# Patient Record
Sex: Female | Born: 1960 | Race: Black or African American | Hispanic: No | Marital: Married
Health system: Southern US, Community
[De-identification: ages and names within clinical notes are randomized; demographics above are authoritative.]

---

## 2013-04-22 HISTORY — PX: BREAST BIOPSY: SHX20

## 2018-01-13 DIAGNOSIS — M25572 Pain in left ankle and joints of left foot: Secondary | ICD-10-CM | POA: Diagnosis not present

## 2018-01-13 DIAGNOSIS — M76822 Posterior tibial tendinitis, left leg: Secondary | ICD-10-CM | POA: Diagnosis not present

## 2018-01-23 ENCOUNTER — Other Ambulatory Visit
Admission: RE | Admit: 2018-01-23 | Discharge: 2018-01-23 | Disposition: A | Discharge status: Home / Self Care | Payer: 59 | Source: Ambulatory Visit | Attending: Physician Assistant | Admitting: Physician Assistant

## 2018-01-23 DIAGNOSIS — Z7689 Persons encountering health services in other specified circumstances: Secondary | ICD-10-CM | POA: Diagnosis not present

## 2018-01-23 DIAGNOSIS — R0600 Dyspnea, unspecified: Secondary | ICD-10-CM | POA: Diagnosis not present

## 2018-01-23 DIAGNOSIS — R7303 Prediabetes: Secondary | ICD-10-CM | POA: Diagnosis not present

## 2018-01-23 DIAGNOSIS — R55 Syncope and collapse: Secondary | ICD-10-CM | POA: Diagnosis not present

## 2018-01-23 DIAGNOSIS — R0602 Shortness of breath: Secondary | ICD-10-CM | POA: Diagnosis not present

## 2018-01-23 LAB — FIBRIN DERIVATIVES D-DIMER (ARMC ONLY): FIBRIN DERIVATIVES D-DIMER (ARMC): 415.14 ng{FEU}/mL (ref 0.00–499.00)

## 2018-02-03 DIAGNOSIS — R55 Syncope and collapse: Secondary | ICD-10-CM | POA: Diagnosis not present

## 2018-02-03 DIAGNOSIS — R0600 Dyspnea, unspecified: Secondary | ICD-10-CM | POA: Diagnosis not present

## 2018-02-03 DIAGNOSIS — R002 Palpitations: Secondary | ICD-10-CM | POA: Diagnosis not present

## 2018-02-20 DIAGNOSIS — R55 Syncope and collapse: Secondary | ICD-10-CM | POA: Diagnosis not present

## 2018-02-20 DIAGNOSIS — R002 Palpitations: Secondary | ICD-10-CM | POA: Diagnosis not present

## 2018-02-20 DIAGNOSIS — R42 Dizziness and giddiness: Secondary | ICD-10-CM | POA: Diagnosis not present

## 2018-02-20 DIAGNOSIS — Z780 Asymptomatic menopausal state: Secondary | ICD-10-CM | POA: Diagnosis not present

## 2018-02-20 DIAGNOSIS — R0600 Dyspnea, unspecified: Secondary | ICD-10-CM | POA: Diagnosis not present

## 2018-02-20 DIAGNOSIS — R0683 Snoring: Secondary | ICD-10-CM | POA: Diagnosis not present

## 2018-02-20 DIAGNOSIS — R7303 Prediabetes: Secondary | ICD-10-CM | POA: Diagnosis not present

## 2018-02-23 ENCOUNTER — Other Ambulatory Visit: Payer: Self-pay | Admitting: Physician Assistant

## 2018-02-24 ENCOUNTER — Other Ambulatory Visit: Payer: Self-pay | Admitting: Physician Assistant

## 2018-02-24 DIAGNOSIS — G4489 Other headache syndrome: Secondary | ICD-10-CM

## 2018-02-25 DIAGNOSIS — G4733 Obstructive sleep apnea (adult) (pediatric): Secondary | ICD-10-CM | POA: Diagnosis not present

## 2018-03-18 ENCOUNTER — Ambulatory Visit: Payer: Self-pay

## 2018-03-25 DIAGNOSIS — G4733 Obstructive sleep apnea (adult) (pediatric): Secondary | ICD-10-CM | POA: Diagnosis not present

## 2018-03-25 DIAGNOSIS — R42 Dizziness and giddiness: Secondary | ICD-10-CM | POA: Diagnosis not present

## 2018-03-25 DIAGNOSIS — R7303 Prediabetes: Secondary | ICD-10-CM | POA: Diagnosis not present

## 2018-04-20 ENCOUNTER — Ambulatory Visit: Payer: 59

## 2018-04-20 ENCOUNTER — Ambulatory Visit
Admission: RE | Admit: 2018-04-20 | Discharge: 2018-04-20 | Disposition: A | Discharge status: Home / Self Care | Payer: 59 | Source: Ambulatory Visit | Attending: Physician Assistant | Admitting: Physician Assistant

## 2018-04-20 DIAGNOSIS — G4489 Other headache syndrome: Secondary | ICD-10-CM | POA: Insufficient documentation

## 2018-04-20 MED ORDER — GADOBUTROL 1 MMOL/ML IV SOLN
10.0000 mL | Freq: Once | INTRAVENOUS | Status: AC | PRN
  Administered 2018-04-20: 10 mL via INTRAVENOUS

## 2018-06-01 ENCOUNTER — Ambulatory Visit: Payer: 59 | Attending: Neurology

## 2018-06-01 VITALS — BP 121/81 | HR 70

## 2018-06-01 DIAGNOSIS — R2681 Unsteadiness on feet: Secondary | ICD-10-CM | POA: Diagnosis present

## 2018-06-01 DIAGNOSIS — R42 Dizziness and giddiness: Secondary | ICD-10-CM

## 2018-06-01 NOTE — Therapy (Signed)
[place] [facility] MAIN [facility] SERVICES [address] [place], [place], [address] Phone: [phone]   Fax:  [phone]  Physical Therapy Evaluation  Patient Details  Name: Sydney Wright MRN: [id number] Date of Birth: 04/20/1961 Referring Provider (PT): Dr. Sherryll Burger   Encounter Date: 06/01/2018  PT End of Session - 06/02/18 2133    Visit Number  1    Number of Visits  25    Date for PT Re-Evaluation  08/24/18    Authorization Type  eval: 06/01/18    PT Start Time  0800    PT Stop Time  0900    PT Time Calculation (min)  60 min    Equipment Utilized During Treatment  Gait belt    Activity Tolerance  Patient tolerated treatment well    Behavior During Therapy  [facility] for tasks assessed/performed       History reviewed. No pertinent past medical history.  History reviewed. No pertinent surgical history.  Vitals:   06/01/18 0803  BP: 121/81  Pulse: 70  SpO2: 100%     Subjective Assessment - 06/02/18 2131    Subjective  Imbalance/dizziness    Pertinent History  Pt reports that approximately 1 year ago she tripped on a cable and fell onto her L side. She was shaken up and tender on the L side. She reports that at that time she felt like her L foot had a little "twitch" to it. She started an exercise program and after getting off the bike one session she couldn't stand on her L foot because of the "pressure" in her foot. She went to see the podiatrist who stated that she had a severe strain in her L foot and was put into an ankle brace. She was diagnosed with L posterior tibial tendinitis. She reports that she started to feel "off-balance" and dizzy when walking after that incident. "I start to feel wobbly when I walk." She complains of imbalance but also reports feeling "dizzy". "I feel nervous about falling." She states if she smells certain smells or changes in the weather occur it can trigger a dizzy episode which would last "a couple minutes." She  started to take Vitamin D and vitamin B12 at the recommendation of Dr. Sherryll Burger and states that she feels like her symptoms are getting better. She has only had one dizzy spell in the last 4 weeks. She is also complaining of L foot pain initially upon standing in the morning but pain improves after walking approximately 15 feet. .  She denies any associated numbness but does complain of weakness in her L ankle/foot. She complains of numbness for "a few seconds" in her 2nd or 3rd toe of the left foot. She saw [facility] and all testing was negative. She saw neurology who ordered an NCV of her LLE which was WNL. She had a MRI Brain done on 04/20/2018 which showed an arachnoid cyst but no findings which explaned her symptoms. Pt has seen cardiology and per medical record they ruled out any possible cardiac causes of her symptoms. She denies any chest pain during episodes but does reports some "shortness of breath." She denies DOE with exercise. She has a history migraines but these always occurred with her menstrual cycle. Pt states she "crossed over into menopause" as of November 2019. Last migraine was a year ago and was associated with light and sound sensitivity. She also would have nausea and dizziness with her migraines. Pt denies any new onset stress  or anxiety. No prior history of panic attacks. She was recently diagnosed with OSA based on a sleep study. She has not started to use a CPAP yet. No history of ocular or vestibular migraines. Other than the above history pt denies any additional recent changes in her health.    Diagnostic tests  MRI: see history    Patient Stated Goals  Decrease dizziness, improve balance, and get rid of her foot pain    Currently in Pain?  No/denies        [facility] - 06/03/18 0824      Assessment   Medical Diagnosis  Dizziness/imbalance    Referring Provider (PT)  Dr. Sherryll BurgerShah    Onset Date/Surgical Date  06/02/17   Approximate   Next MD Visit  Not reported by patient     Prior Therapy  None reported by patient      Precautions   Precautions  None    Precaution Comments  She wears a L ankle brace intermittently      Restrictions   Weight Bearing Restrictions  No      Balance Screen   Has the patient fallen in the past 6 months  No    Has the patient had a decrease in activity level because of a fear of falling?   Yes    Is the patient reluctant to leave their home because of a fear of falling?   No      Home Environment   Living Environment  Private residence    Available Help at Discharge  Family    Type of Home  House    Home Access  Level entry    Home Layout  Two level    Alternate Level Stairs-Number of Steps  14    Alternate Level Stairs-Rails  Right      Prior Function   Level of Independence  Independent    Vocation  Full time employment    Vocation Requirements  Works in Games developerpurchasing      Cognition   Overall Cognitive Status  Within Functional Limits for tasks assessed      Observation/Other Assessments   Other Surveys   Other Surveys    Activities of Corporate investment bankerBalance Confidence Scale (ABC Scale)   81.25%    Dizziness Handicap Inventory (DHI)   48/100         VESTIBULAR AND BALANCE EVALUATION   HISTORY:  Subjective history of current problem: Pt reports that approximately 1 year ago she tripped on a cable and fell onto her L side. She was shaken up and tender on the L side. She reports that at that time she felt like her L foot had a little "twitch" to it. She started an exercise program and after getting off the bike one session she couldn't stand on her L foot because of the "pressure" in her foot. She went to see the podiatrist who stated that she had a severe strain in her L foot and was put into an ankle brace. She was diagnosed with L posterior tibial tendinitis. She reports that she started to feel "off-balance" and dizzy when walking after that incident. "I start to feel wobbly when I walk." She complains of imbalance but also  reports feeling "dizzy". "I feel nervous about falling." She states if she smells certain smells or changes in the weather occur it can trigger a dizzy episode which would last "a couple minutes." She started to take Vitamin D and vitamin B12 at  the recommendation of Dr. Sherryll Burger and states that she feels like her symptoms are getting better. She has only had one dizzy spell in the last 4 weeks. She is also complaining of L foot pain initially upon standing in the morning but pain improves after walking approximately 15 feet. .  She denies any associated numbness but does complain of weakness in her L ankle/foot. She complains of numbness for "a few seconds" in her 2nd or 3rd toe of the left foot. She saw [facility] and all testing was negative. She saw neurology who ordered an NCV of her LLE which was WNL. She had a MRI Brain done on 04/20/2018 which showed an arachnoid cyst but no findings which explaned her symptoms. Pt has seen cardiology and per medical record they ruled out any possible cardiac causes of her symptoms. She denies any chest pain during episodes but does reports some "shortness of breath." She denies DOE with exercise. She has a history migraines but these always occurred with her menstrual cycle. Pt states she "crossed over into menopause" as of November 2019. Last migraine was a year ago and was associated with light and sound sensitivity. She also would have nausea and dizziness with her migraines. Pt denies any new onset stress or anxiety. No prior history of panic attacks. She was recently diagnosed with OSA based on a sleep study. She has not started to use a CPAP yet. No history of ocular or vestibular migraines. Other than the above history pt denies any additional recent changes in her health.  Description of dizziness: (vertigo, unsteadiness, lightheadedness, falling, general unsteadiness, whoozy, swimmy-headed sensation, aural fullness) Pt describes a rotary type sensation but denies  "vertigo." I feel like I'm tilted to the side. Pt reports that she feels "faint" due to being nervous that she is going to pass out or black out.  Frequency: Previously 1-2x/day but over the last 4 weeks since starting VitB12 and Vit D she has only had one episode. 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 Likes to read and go to the movie  Auditory complaints (tinnitus, pain, drainage): None Vision (last eye exam, diplopia, recent changes): None  Red Flags: (dysarthria, dysphagia, drop attacks, bowel and bladder changes, recent weight loss/gain) Review of systems negative for red flags.     EXAMINATION  POSTURE:   NEUROLOGICAL SCREEN: (2+ unless otherwise noted.) N=normal  Ab=abnormal  Level Dermatome R L Myotome R L Reflex R L  C3 Anterior Neck N N Sidebend C2-3 N N Jaw CN V    C4 Top of Shoulder N N Shoulder Shrug C4 N N Hoffman's UMN    C5 Lateral Upper Arm N N Shoulder ABD C4-5 N N Biceps C5-6    C6 Lateral Arm/ Thumb N N Arm Flex/ Wrist Ext C5-6 N N Brachiorad. C5-6    C7 Middle Finger N N Arm Ext//Wrist Flex C6-7 N N Triceps C7    C8 4th & 5th Finger N N Flex/ Ext Carpi Ulnaris C8 N N Patellar (L3-4)    T1 Medial Arm N  N Interossei T1 N N Gastrocnemius    L2 Medial thigh/groin N N Illiopsoas (L2-3) N N     L3 Lower thigh/med.knee N N Quadriceps (L3-4) N N     L4 Medial leg/lat thigh N N Tibialis Ant (L4-5) N N     L5 Lat. leg & dorsal foot N N EHL (L5)  N N     S1 post/lat foot/thigh/leg N N Gastrocnemius (S1-2) N N     S2 Post./med. thigh & leg N N Hamstrings (L4-S3) N N       SOMATOSENSORY:         Sensation           Intact      Diminished         Absent  Light touch WNL      COORDINATION: Finger to Nose: Normal Heel to Shin: Normal Pronator Drift: Negative Rapid alternating movements: Negative; Finger opposition: Negative  MUSCULOSKELETAL SCREEN: Cervical Spine ROM: WFL and painless in all planes. No gross deficits identified   ROM: WNL  MMT: WNL  Functional Mobility: WNL  Gait: Scanning of visual environment with gait is: WNL   OCULOMOTOR / VESTIBULAR TESTING:  Oculomotor Exam- Room Light  Findings Comments  Ocular Alignment normal   Ocular ROM normal   Spontaneous Nystagmus normal   End-Gaze Nystagmus normal   Smooth Pursuit normal   Saccades normal   VOR normal   VOR Cancellation normal   Left Head Thrust normal   Right Head Thrust normal   Head Shaking Nystagmus not examined   Static Acuity not examined   Dynamic Acuity not examined     Oculomotor Exam- Fixation Suppressed  Findings Comments  Ocular Alignment normal   Ocular ROM normal   Spontaneous Nystagmus normal   End-Gaze Nystagmus normal   Head Shaking Nystagmus normal     BPPV TESTS:  Symptoms Duration Intensity Nystagmus  L Dix-Hallpike None   None  R Dix-Hallpike Mild vertigo 10-15 seconds Does not rate Possibly a few R torsional upbeats but difficult too discern  L Head Roll None   None  R Head Roll None   None  L Sidelying Test      R Sidelying Test        FUNCTIONAL OUTCOME MEASURES:  Results Comments  DHI 48/100 Significant disability, in need of itnervention  ABC Scale 81.25% WNL          Objective measurements completed on examination: See above findings.      TREATMENT   Canalith Repositioning Treatment Due to possible positive R Dix-Hallpike test for R posterior canal BPPV pt treated with one bout of R  CRT. 1 minute holds in each position. Education provided to patient regarding possible diagnosis.        PT Education - 06/02/18 2133    Education Details  Plan of care    Person(s) Educated  Patient    Methods  Explanation    Comprehension  Verbalized understanding       PT Short Term Goals - 06/02/18 2145      PT SHORT TERM GOAL #1   Title  Pt will be independent with HEP in order to improve strength and balance in order to decrease fall risk and improve function at home and work.     Time  6    Period  Weeks    Status  New    Target Date  07/14/18        PT Long  Term Goals - 06/02/18 2146      PT LONG TERM GOAL #1   Title  Pt will report no further episodes of vertigo in the Dix-Hallpike position in order to resume her full function at home, work, and with leisure activities without increase in symptoms.     Baseline  06/01/18:    Time  12    Period  Weeks    Status  New    Target Date  08/24/18      PT LONG TERM GOAL #2   Title  Pt will decrease DHI score by at least 18 points in order to demonstrate clinically significant reduction in disability     Baseline  06/01/18: 48/100    Time  12    Period  Weeks    Status  New    Target Date  08/24/18             Plan - 06/02/18 2143    Clinical Impression Statement  Pt is a pleasant 58 year-old female referred for difficulty with balance/dizziness. Patient's history is quite complex and she has seen multiple specialists without any findings which explain her symptoms. PT examination is also mostly unremarkable however it is somewhat limited today secondary to extensive history. Oculomotor/vestibular exam is mostly normal with the exception of possibly positive R Dix-Hallpike test. Pt reports some short duration vertigo and there is possibly some R torsional upbeating nystagmus but it is very faint and difficult to detect. Pt taken through Epley maneuver for R posterior canal to see if it helps with her symptoms as it  certainly shouldn't aggravate any of her symptoms. Otherwise her symptoms don't easily fit into any other clear vestibular diagnosis. It is possible that anxiety may be a contributing factors to these acute, short duration symptoms that are associated with shortness of breath and lightheadedness. Will complete additional balance testing in future sessions and treat with canalith repositioning as appropriate. Once balance has resolved pt would like to pursue further treatment for her L ankle so order will be requested from her podiatrist. Pt presents with deficits in dizziness and balance and will benefit from skilled PT services to address deficits in balance and decrease risk for future falls.     History and Personal Factors relevant to plan of care:  High (unstable): 3 or more personal factors/comorbidities, 4 or more body systems/activity limitations/participation restrictions     Clinical Presentation  Unstable    Clinical Decision Making  High    Rehab Potential  Fair    PT Frequency  2x / week    PT Duration  12 weeks    PT Treatment/Interventions  ADLs/Self Care Home Management;Biofeedback;Aquatic Therapy;Canalith Repostioning;Cryotherapy;Electrical Stimulation;Iontophoresis 4mg /ml Dexamethasone;Moist Heat;Traction;Ultrasound;DME Instruction;Gait training;Stair training;Functional mobility training;Therapeutic activities;Therapeutic exercise;Balance training;Neuromuscular re-education;Patient/family education;Manual techniques;Passive range of motion;Dry needling;Vestibular    PT Next Visit Plan  BERG, FGA or DGI, recheck R Dix-Hallpike and treat with Epley if appropriate    PT Home Exercise Plan  None currently    Consulted and Agree with Plan of Care  Patient       Patient will benefit from skilled therapeutic intervention in order to improve the following deficits and impairments:  Dizziness, Decreased balance, Difficulty walking  Visit Diagnosis: Dizziness and giddiness - Plan: PT plan  of care cert/re-cert  Unsteadiness on feet - Plan: PT plan of care cert/re-cert     Problem List There are no active problems to display for this patient.  Sharalyn Ink Huprich PT, DPT, GCS  Huprich,Jason 06/03/2018, 8:34 AM  [facility] MAIN [facility] SERVICES [address] [place], [place], [address] Phone: [phone]   Fax:  [phone]  Name: FATINA SPRANKLE MRN: [id number] Date of Birth: 05-15-1960

## 2018-06-03 ENCOUNTER — Ambulatory Visit: Payer: 59

## 2018-06-08 ENCOUNTER — Ambulatory Visit: Payer: 59

## 2018-06-08 DIAGNOSIS — R2681 Unsteadiness on feet: Secondary | ICD-10-CM

## 2018-06-08 DIAGNOSIS — R42 Dizziness and giddiness: Secondary | ICD-10-CM | POA: Diagnosis not present

## 2018-06-08 NOTE — Therapy (Signed)
[facility] [address] [place], [place], [address] Phone: [phone]   Fax:  [phone]  Physical Therapy Treatment  Patient Details  Name: Sandria ManlyJacqueline B Hughley MRN: [id number] Date of Birth: 01/04/1961 Referring Provider (PT): Dr. Sherryll BurgerShah   Encounter Date: 06/08/2018  PT End of Session - 06/08/18 1520    Visit Number  2    Number of Visits  25    Date for PT Re-Evaluation  08/24/18    Authorization Type  eval: 06/01/18    PT Start Time  1517    PT Stop Time  1600    PT Time Calculation (min)  43 min    Equipment Utilized During Treatment  Gait belt    Activity Tolerance  Patient tolerated treatment well    Behavior During Therapy  [facility] for tasks assessed/performed       History reviewed. No pertinent past medical history.  History reviewed. No pertinent surgical history.  There were no vitals filed for this visit.  Subjective Assessment - 06/08/18 1519    Subjective  Pt reports that she is doing well on this date. She complains of some L ankle pain upon arrival. No acute episodes of dizziness since her initial evaluation. She would like to know if we can also treat her for her ankle pain.     Pertinent History  Pt reports that approximately 1 year ago she tripped on a cable and fell onto her L side. She was shaken up and tender on the L side. She reports that at that time she felt like her L foot had a little "twitch" to it. She started an exercise program and after getting off the bike one session she couldn't stand on her L foot because of the "pressure" in her foot. She went to see the podiatrist who stated that she had a severe strain in her L foot and was put into an ankle brace. She was diagnosed with L posterior tibial tendinitis. She reports that she started to feel "off-balance" and dizzy when walking after that incident. "I start to feel wobbly when I walk." She complains of imbalance but also reports feeling "dizzy".  "I feel nervous about falling." She states if she smells certain smells or changes in the weather occur it can trigger a dizzy episode which would last "a couple minutes." She started to take Vitamin D and vitamin B12 at the recommendation of Dr. Sherryll BurgerShah and states that she feels like her symptoms are getting better. She has only had one dizzy spell in the last 4 weeks. She is also complaining of L foot pain initially upon standing in the morning but pain improves after walking approximately 15 feet. .  She denies any associated numbness but does complain of weakness in her L ankle/foot. She complains of numbness for "a few seconds" in her 2nd or 3rd toe of the left foot. She saw [facility] and all testing was negative. She saw neurology who ordered an NCV of her LLE which was WNL. She had a MRI Brain done on 04/20/2018 which showed an arachnoid cyst but no findings which explaned her symptoms. Pt has seen cardiology and per medical record they ruled out any possible cardiac causes of her symptoms. She denies any chest pain during episodes but does reports some "shortness of breath." She denies DOE with exercise. She has a history migraines but these always occurred with her menstrual cycle. Pt states she "crossed over into menopause"  as of November 2019. Last migraine was a year ago and was associated with light and sound sensitivity. She also would have nausea and dizziness with her migraines. Pt denies any new onset stress or anxiety. No prior history of panic attacks. She was recently diagnosed with OSA based on a sleep study. She has not started to use a CPAP yet. No history of ocular or vestibular migraines. Other than the above history pt denies any additional recent changes in her health.    Diagnostic tests  MRI: see history    Patient Stated Goals  Decrease dizziness, improve balance, and get rid of her foot pain    Currently in Pain?  Yes    Pain Score  2     Pain Location  Ankle    Pain Orientation   Medial;Left    Pain Descriptors / Indicators  Burning    Pain Type  Chronic pain    Pain Onset  More than a month ago    Pain Frequency  Intermittent         TREATMENT  [facility] PT Assessment - 06/08/18 1539      Standardized Balance Assessment   Standardized Balance Assessment  Berg Balance Test      Berg Balance Test   Sit to Stand  Able to stand without using hands and stabilize independently    Standing Unsupported  Able to stand safely 2 minutes    Sitting with Back Unsupported but Feet Supported on Floor or Stool  Able to sit safely and securely 2 minutes    Stand to Sit  Sits safely with minimal use of hands    Transfers  Able to transfer safely, minor use of hands    Standing Unsupported with Eyes Closed  Able to stand 10 seconds safely    Standing Ubsupported with Feet Together  Able to place feet together independently and stand 1 minute safely    From Standing, Reach Forward with Outstretched Arm  Can reach confidently >25 cm (10")    From Standing Position, Pick up Object from Floor  Able to pick up shoe safely and easily    From Standing Position, Turn to Look Behind Over each Shoulder  Looks behind from both sides and weight shifts well    Turn 360 Degrees  Able to turn 360 degrees safely in 4 seconds or less    Standing Unsupported, Alternately Place Feet on Step/Stool  Able to stand independently and safely and complete 8 steps in 20 seconds    Standing Unsupported, One Foot in Front  Able to plae foot ahead of the other independently and hold 30 seconds    Standing on One Leg  Tries to lift leg/unable to hold 3 seconds but remains standing independently    Total Score  52      Functional Gait  Assessment   Gait assessed   Yes    Gait Level Surface  Walks 20 ft in less than 5.5 sec, no assistive devices, good speed, no evidence for imbalance, normal gait pattern, deviates no more than 6 in outside of the 12 in walkway width.    Change in Gait Speed  Able to smoothly  change walking speed without loss of balance or gait deviation. Deviate no more than 6 in outside of the 12 in walkway width.    Gait with Horizontal Head Turns  Performs head turns smoothly with no change in gait. Deviates no more than 6 in outside 12 in walkway  width    Gait with Vertical Head Turns  Performs head turns with no change in gait. Deviates no more than 6 in outside 12 in walkway width.    Gait and Pivot Turn  Pivot turns safely within 3 sec and stops quickly with no loss of balance.    Step Over Obstacle  Is able to step over 2 stacked shoe boxes taped together (9 in total height) without changing gait speed. No evidence of imbalance.    Gait with Narrow Base of Support  Ambulates 4-7 steps.    Gait with Eyes Closed  Walks 20 ft, uses assistive device, slower speed, mild gait deviations, deviates 6-10 in outside 12 in walkway width. Ambulates 20 ft in less than 9 sec but greater than 7 sec.    Ambulating Backwards  Walks 20 ft, no assistive devices, good speed, no evidence for imbalance, normal gait    Steps  Alternating feet, no rail.    Total Score  27       TREATMENT  Neuromuscular Re-education  Negative Dix-Hallpike and roll testing bilaterally; Performed outcome measures with patient including: BERG: 52/56 FGA: 27/30 MiniBest: 26/28 Static visual acuity: 20/13 Dynamic visual acuity: 20/20 mCTSIB (see below) Issued and explained HEP; Issued vestibular migraine handout and educated pt regarding common triggers;    POSTURAL CONTROL TESTS:   Clinical Test of Sensory Interaction for Balance    (CTSIB):  CONDITION TIME STRATEGY SWAY  Eyes open, firm surface 30 seconds ankle 1+  Eyes closed, firm surface 30 seconds ankle 3+  Eyes open, foam surface 30 seconds ankle 2+  Eyes closed, foam surface 30 seconds ankle 3+      Dix-Hallpike and roll test is negative bilaterally on this date. Proceeded to perform additional outcome measures with patient. Overall she  demonstrates good balance. BERG was 52/56 with most deficits in tandem and single leg balance. FGA 27/30, MiniBest: 26/28, and DVA negative. Pt issued tandem and single leg balance to initiate HEP. Pt provided handout regarding vestibular migraines and encouraged her to look at common triggers. Will perform a couple additional balance treatments but pt reports she feels approximately 90% improved since her initial symptom onset. Once balance has resolved will proceed to request an order for PT from her L ankle pain. Per podiatry note she was diagnosed with posterior tibial tendonitis and he was considering referring for PT if it didn't resolve with ankle brace. Pt will benefit from PT services to address deficits in strength, balance, and mobility in order to return to full function at home.                  PT Short Term Goals - 06/02/18 2145      PT SHORT TERM GOAL #1   Title  Pt will be independent with HEP in order to improve strength and balance in order to decrease fall risk and improve function at home and work.     Time  6    Period  Weeks    Status  New    Target Date  07/14/18        PT Long Term Goals - 06/02/18 2146      PT LONG TERM GOAL #1   Title  Pt will report no further episodes of vertigo in the Dix-Hallpike position in order to resume her full function at home, work, and with leisure activities without increase in symptoms.     Baseline  06/01/18:    Time  12    Period  Weeks    Status  New    Target Date  08/24/18      PT LONG TERM GOAL #2   Title  Pt will decrease DHI score by at least 18 points in order to demonstrate clinically significant reduction in disability     Baseline  06/01/18: 48/100    Time  12    Period  Weeks    Status  New    Target Date  08/24/18            Plan - 06/08/18 1520    Clinical Impression Statement  Dix-Hallpike and roll test is negative bilaterally on this date. Proceeded to perform additional outcome measures  with patient. Overall she demonstrates good balance. BERG was 52/56 with most deficits in tandem and single leg balance. FGA 27/30, MiniBest: 26/28, and DVA negative. Pt issued tandem and single leg balance to initiate HEP. Pt provided handout regarding vestibular migraines and encouraged her to look at common triggers. Will perform a couple additional balance treatments but pt reports she feels approximately 90% improved since her initial symptom onset. Once balance has resolved will proceed to request an order for PT from her L ankle pain. Per podiatry note she was diagnosed with posterior tibial tendonitis and he was considering referring for PT if it didn't resolve with ankle brace. Pt will benefit from PT services to address deficits in strength, balance, and mobility in order to return to full function at home.      Rehab Potential  Fair    PT Frequency  2x / week    PT Duration  12 weeks    PT Treatment/Interventions  ADLs/Self Care Home Management;Biofeedback;Aquatic Therapy;Canalith Repostioning;Cryotherapy;Electrical Stimulation;Iontophoresis 4mg /ml Dexamethasone;Moist Heat;Traction;Ultrasound;DME Instruction;Gait training;Stair training;Functional mobility training;Therapeutic activities;Therapeutic exercise;Balance training;Neuromuscular re-education;Patient/family education;Manual techniques;Passive range of motion;Dry needling;Vestibular    PT Next Visit Plan  Review HEP, progress strength/balance, recheck R Dix-Hallpike and treat with Epley if appropriate    PT Home Exercise Plan  tandem balance, single leg balance    Consulted and Agree with Plan of Care  Patient       Patient will benefit from skilled therapeutic intervention in order to improve the following deficits and impairments:  Dizziness, Decreased balance, Difficulty walking  Visit Diagnosis: Dizziness and giddiness  Unsteadiness on feet     Problem List There are no active problems to display for this  patient.  Lynnea Maizes PT, DPT, GCS  , 06/09/2018, 4:58 PM  [facility] MAIN [facility] SERVICES [address] [place], [place], [address] Phone: [phone]   Fax:  [phone]  Name: ISELIS MEHALIC MRN: [id number] Date of Birth: 05-04-1960

## 2018-06-08 NOTE — Patient Instructions (Signed)
Access Code: [email or web address]  URL: [email or web address]  Date: 06/08/2018  Prepared by: Ria Comment   Exercises  Tandem Stance - 3 reps - 30 seconds on each leg hold - 2x daily - 7x weekly  Single Leg Stance - 3 reps - 30 seconds on each leg hold - 2x daily - 7x weekly

## 2018-06-10 ENCOUNTER — Ambulatory Visit: Payer: 59

## 2018-06-15 ENCOUNTER — Ambulatory Visit: Payer: 59

## 2018-06-17 ENCOUNTER — Ambulatory Visit: Payer: 59

## 2018-06-22 ENCOUNTER — Ambulatory Visit: Payer: 59

## 2018-06-24 ENCOUNTER — Ambulatory Visit: Payer: 59

## 2018-06-29 ENCOUNTER — Ambulatory Visit: Payer: 59

## 2018-07-01 ENCOUNTER — Ambulatory Visit: Payer: 59

## 2018-07-06 ENCOUNTER — Ambulatory Visit: Payer: 59

## 2018-07-08 ENCOUNTER — Ambulatory Visit: Payer: 59

## 2018-07-13 ENCOUNTER — Ambulatory Visit: Payer: 59

## 2018-07-15 ENCOUNTER — Ambulatory Visit: Payer: 59

## 2020-10-09 IMAGING — MR MR HEAD WO/W CM
13 series · 48 of 48 positions shown · IV contrast (gadavist)
Comparison: None.

CLINICAL DATA: Other headache syndrome

EXAM:
MRI HEAD WITHOUT AND WITH CONTRAST
TECHNIQUE: Multiplanar, multiecho pulse sequences of the brain and surrounding
structures were obtained without and with intravenous contrast.
CONTRAST:  10 cc Gadavist intravenous

[Series 5: ax dwi_tracew · axial · 3.0mm · 0.60mm/px · z∈[-104,+57]mm · 4 of 55 slices shown]
[im 1/55]
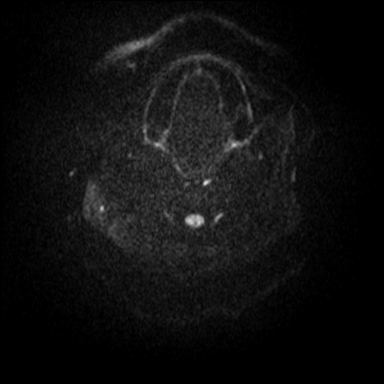
[im 19/55]
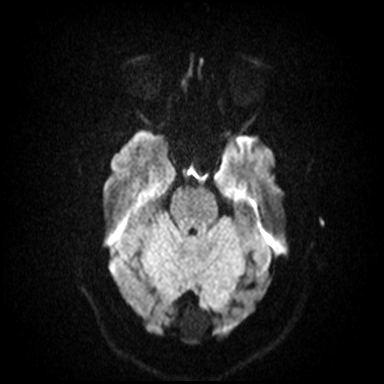
[im 37/55]
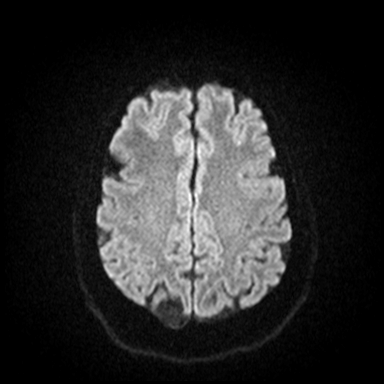
[im 55/55]
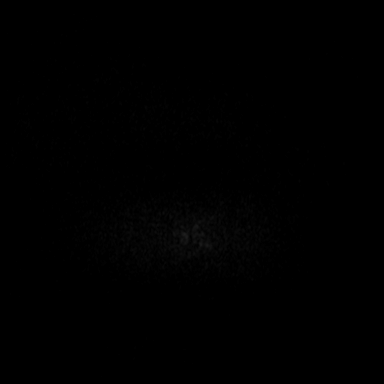

[Series 6: ax dwi_adc · axial · 3.0mm · 0.60mm/px · z∈[-104,+57]mm · 3 of 55 slices shown]
[im 1/55]
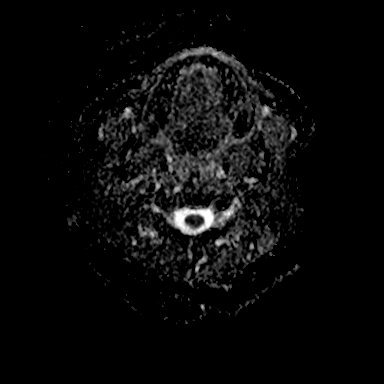
[im 28/55]
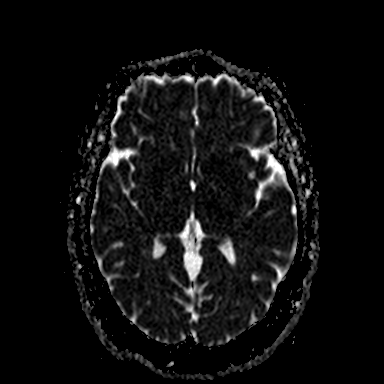
[im 55/55]
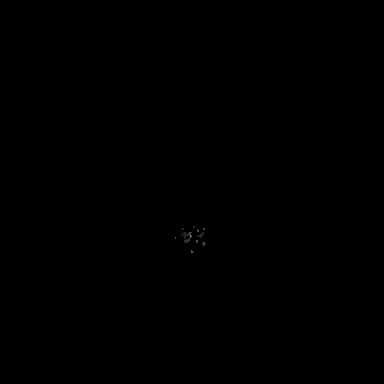

[Series 7: cor dwi_tracew · coronal · 5.0mm · 0.60mm/px · 2 of 41 slices shown]
[im 1/41]
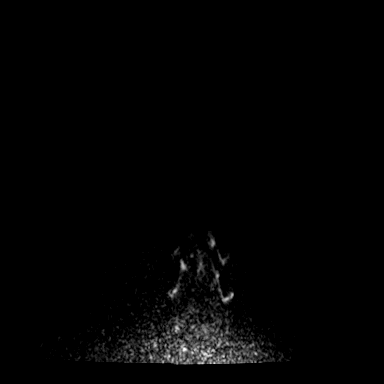
[im 41/41]
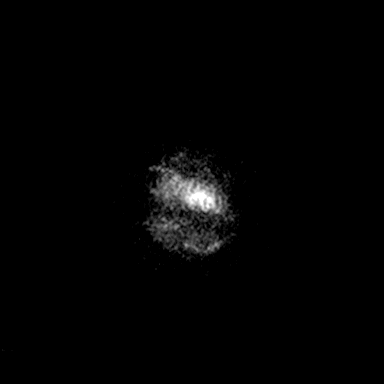

[Series 8: cor dwi_adc · coronal · 5.0mm · 0.60mm/px · 2 of 41 slices shown]
[im 1/41]
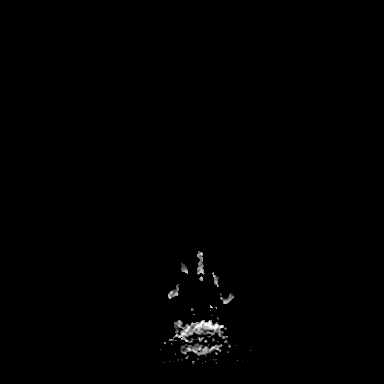
[im 41/41]
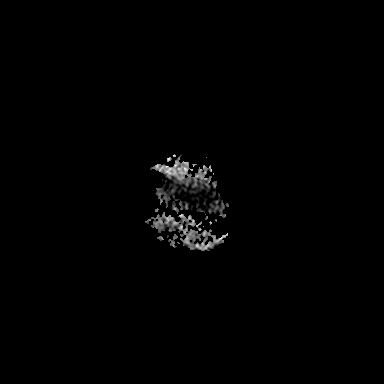

[Series 9: T1 · sagittal · 5.0mm · 0.62mm/px · 1 of 25 slices shown (1 of 2)]
[im 1/25]
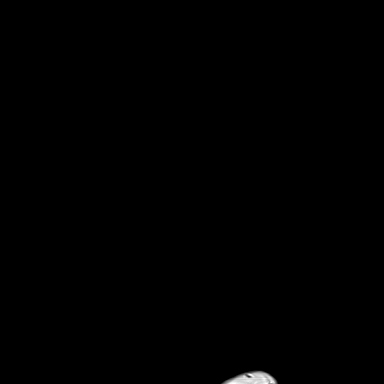

[Series 10: T2 · axial · 5.0mm · 0.53mm/px · z∈[-99,+56]mm · 2 of 27 slices shown]
[im 1/27]
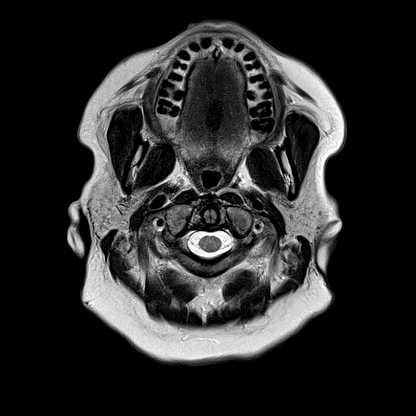
[im 27/27]
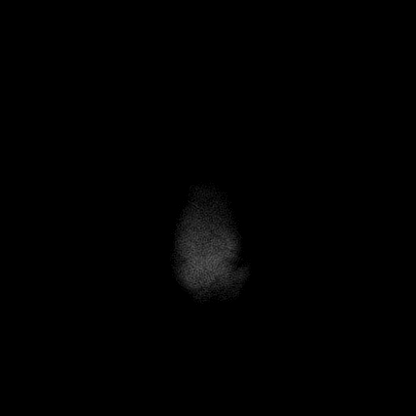

[Series 11: swi_images · axial · 3.0mm · 0.90mm/px · z∈[-109,+67]mm · 4 of 60 slices shown]
[im 1/60]
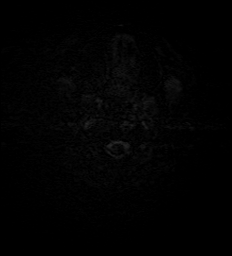
[im 20/60]
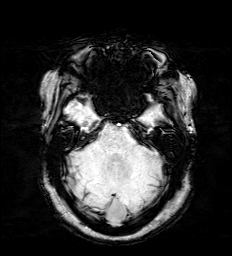
[im 40/60]
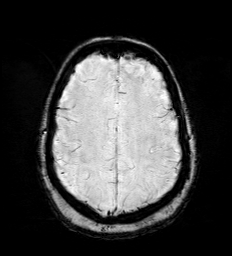
[im 60/60]
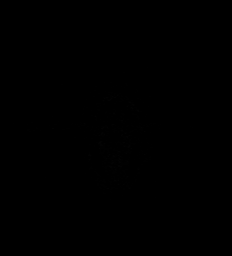

[Series 12: mip_images(sw) · axial · 24.0mm · 0.90mm/px · z∈[-99,+57]mm · 3 of 53 slices shown]
[im 1/53]
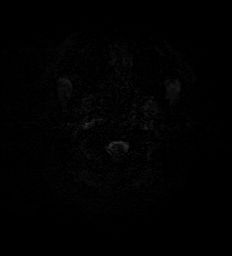
[im 27/53]
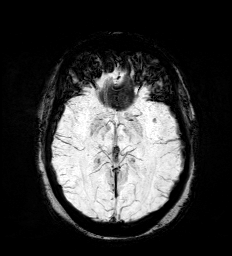
[im 53/53]
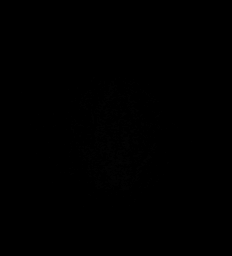

[Series 13: FLAIR · axial · 3.0mm · 0.53mm/px · z∈[-102,+59]mm · 3 of 55 slices shown]
[im 1/55]
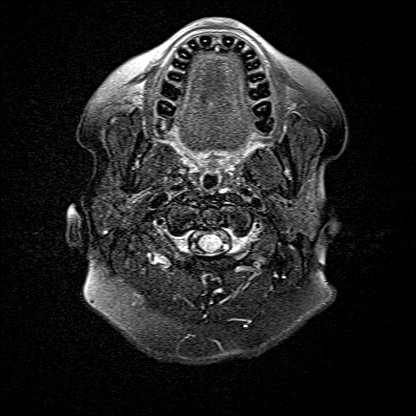
[im 28/55]
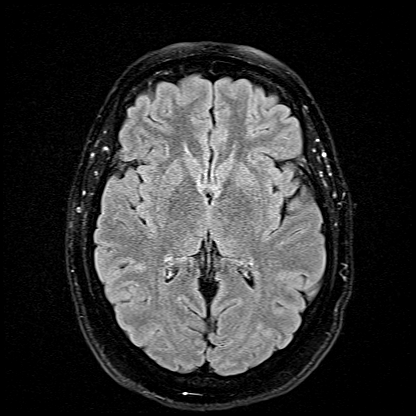
[im 55/55]
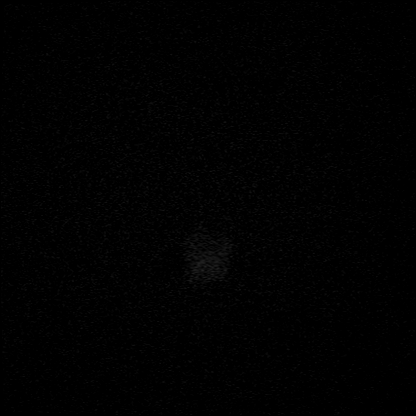

[Series 14: T1 · axial · 1.0mm · 0.98mm/px · z∈[-110,+64]mm · 10 of 175 slices shown (2 of 2)]
[im 1/175]
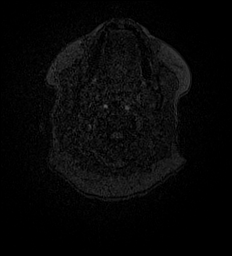
[im 20/175]
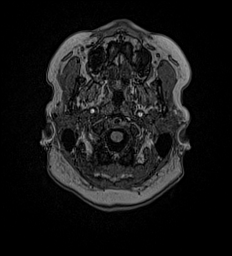
[im 39/175]
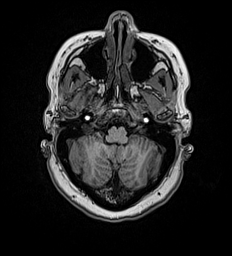
[im 59/175]
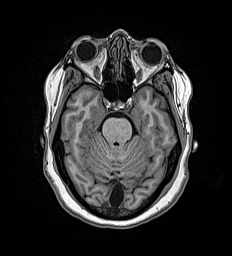
[im 78/175]
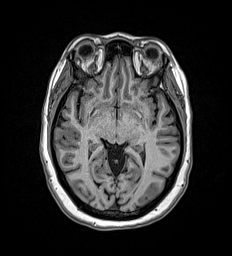
[im 97/175]
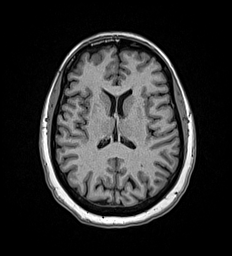
[im 117/175]
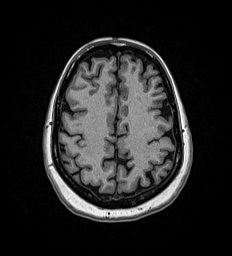
[im 136/175]
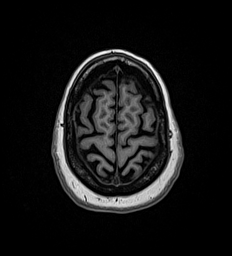
[im 155/175]
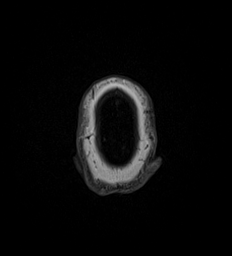
[im 175/175]
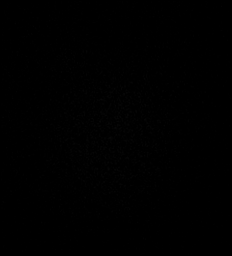

[Series 15: T2 post-contrast · coronal · 5.0mm · 0.57mm/px · 2 of 29 slices shown]
[im 1/29]
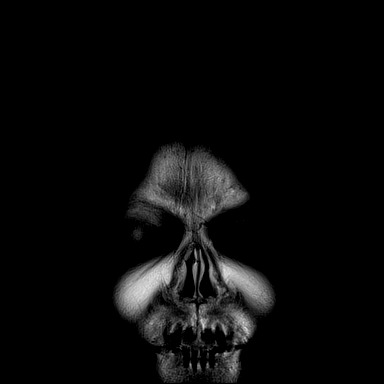
[im 29/29]
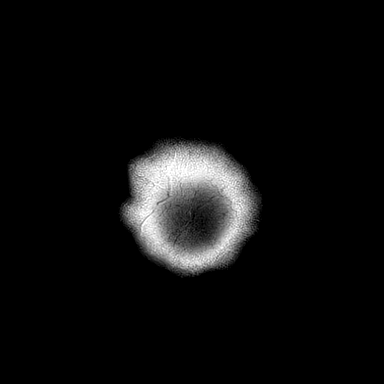

[Series 16: T1 post-contrast · axial · 1.0mm · 0.98mm/px · z∈[-110,+64]mm · 10 of 176 slices shown (1 of 2)]
[im 1/176]
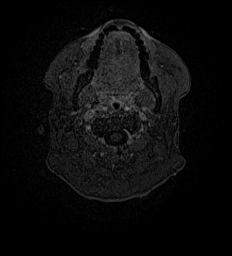
[im 20/176]
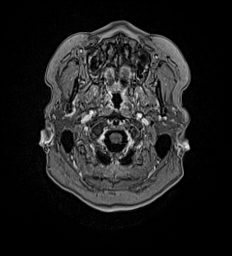
[im 39/176]
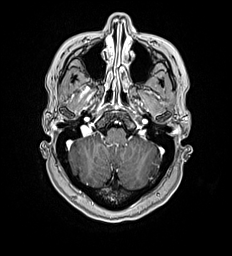
[im 59/176]
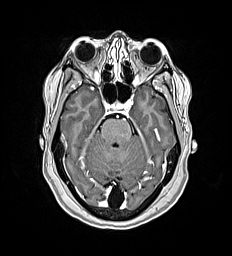
[im 78/176]
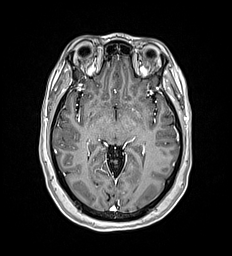
[im 98/176]
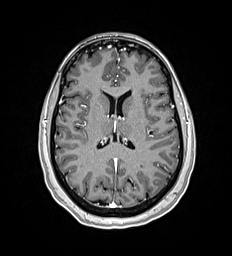
[im 117/176]
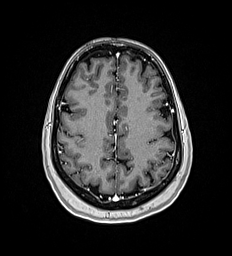
[im 137/176]
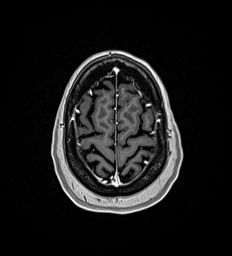
[im 156/176]
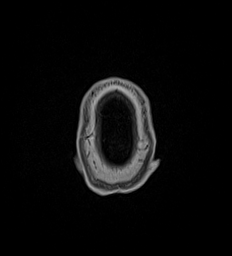
[im 176/176]
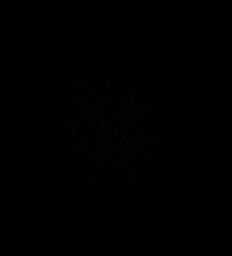

[Series 17: T1 post-contrast · coronal · 5.0mm · 0.57mm/px · 2 of 29 slices shown (2 of 2)]
[im 1/29]
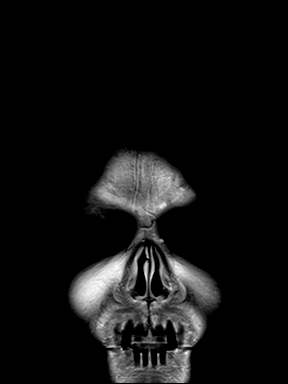
[im 29/29]
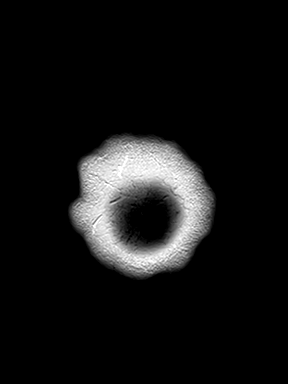

[48 of 48 positions shown; findings below may reference images not displayed]

FINDINGS: Brain: No infarction, hemorrhage, hydrocephalus, or mass lesion.
cm arachnoid cyst left of the falx cerebelli without mass effect on
the cerebellum. Focus of mineralization along the left insular
cortex, nonspecific and likely noncontributory given absent gliosis
or abnormal enhancement.

Vascular: Major flow voids and vascular enhancements are preserved.

Skull and upper cervical spine: Normal marrow signal

Sinuses/Orbits: Negative
IMPRESSION: Negative exam; no specific explanation for headache.

## 2021-11-22 ENCOUNTER — Other Ambulatory Visit: Payer: Self-pay | Admitting: Obstetrics and Gynecology

## 2021-11-22 DIAGNOSIS — Z1231 Encounter for screening mammogram for malignant neoplasm of breast: Secondary | ICD-10-CM

## 2021-12-14 ENCOUNTER — Encounter: Payer: Self-pay | Admitting: Radiology

## 2021-12-14 ENCOUNTER — Ambulatory Visit
Admission: RE | Admit: 2021-12-14 | Discharge: 2021-12-14 | Disposition: A | Discharge status: Home / Self Care | Payer: 59 | Source: Ambulatory Visit | Attending: Obstetrics and Gynecology | Admitting: Obstetrics and Gynecology

## 2021-12-14 DIAGNOSIS — Z1231 Encounter for screening mammogram for malignant neoplasm of breast: Secondary | ICD-10-CM | POA: Insufficient documentation

## 2021-12-18 ENCOUNTER — Other Ambulatory Visit: Payer: Self-pay | Admitting: *Deleted

## 2021-12-18 ENCOUNTER — Inpatient Hospital Stay
Admission: RE | Admit: 2021-12-18 | Discharge: 2021-12-18 | Disposition: A | Discharge status: Home / Self Care | Payer: Self-pay | Source: Ambulatory Visit | Attending: *Deleted | Admitting: *Deleted

## 2021-12-18 DIAGNOSIS — Z1231 Encounter for screening mammogram for malignant neoplasm of breast: Secondary | ICD-10-CM

## 2022-12-25 ENCOUNTER — Other Ambulatory Visit: Payer: Self-pay | Admitting: Obstetrics and Gynecology

## 2022-12-25 DIAGNOSIS — Z1231 Encounter for screening mammogram for malignant neoplasm of breast: Secondary | ICD-10-CM

## 2023-01-03 ENCOUNTER — Ambulatory Visit
Admission: RE | Admit: 2023-01-03 | Discharge: 2023-01-03 | Disposition: A | Discharge status: Home / Self Care | Payer: 59 | Source: Ambulatory Visit | Attending: Obstetrics and Gynecology | Admitting: Obstetrics and Gynecology

## 2023-01-03 DIAGNOSIS — Z1231 Encounter for screening mammogram for malignant neoplasm of breast: Secondary | ICD-10-CM | POA: Insufficient documentation

## 2023-11-05 ENCOUNTER — Other Ambulatory Visit: Payer: Self-pay | Admitting: Obstetrics and Gynecology

## 2023-11-05 DIAGNOSIS — Z1231 Encounter for screening mammogram for malignant neoplasm of breast: Secondary | ICD-10-CM

## 2024-01-09 ENCOUNTER — Ambulatory Visit
Admission: RE | Admit: 2024-01-09 | Discharge: 2024-01-09 | Disposition: A | Discharge status: Home / Self Care | Source: Ambulatory Visit | Attending: Obstetrics and Gynecology | Admitting: Obstetrics and Gynecology

## 2024-01-09 DIAGNOSIS — Z1231 Encounter for screening mammogram for malignant neoplasm of breast: Secondary | ICD-10-CM | POA: Insufficient documentation

## 2024-02-11 ENCOUNTER — Other Ambulatory Visit: Payer: Self-pay | Admitting: Physician Assistant

## 2024-02-11 DIAGNOSIS — M79602 Pain in left arm: Secondary | ICD-10-CM

## 2024-02-11 DIAGNOSIS — E119 Type 2 diabetes mellitus without complications: Secondary | ICD-10-CM

## 2024-02-11 DIAGNOSIS — E782 Mixed hyperlipidemia: Secondary | ICD-10-CM

## 2024-02-20 ENCOUNTER — Ambulatory Visit
Admission: RE | Admit: 2024-02-20 | Discharge: 2024-02-20 | Disposition: A | Discharge status: Home / Self Care | Payer: Self-pay | Source: Ambulatory Visit | Attending: Physician Assistant | Admitting: Physician Assistant

## 2024-02-20 DIAGNOSIS — E119 Type 2 diabetes mellitus without complications: Secondary | ICD-10-CM | POA: Insufficient documentation

## 2024-02-20 DIAGNOSIS — E782 Mixed hyperlipidemia: Secondary | ICD-10-CM | POA: Insufficient documentation

## 2024-02-20 DIAGNOSIS — M79602 Pain in left arm: Secondary | ICD-10-CM | POA: Insufficient documentation
